# Patient Record
Sex: Female | Born: 2000 | Race: White | Hispanic: No | Marital: Single | Smoking: Current every day smoker
Health system: Southern US, Community
[De-identification: ages and names within clinical notes are randomized; demographics above are authoritative.]

---

## 2020-10-13 ENCOUNTER — Emergency Department (HOSPITAL_COMMUNITY)
Admission: EM | Admit: 2020-10-13 | Discharge: 2020-10-13 | Disposition: A | Discharge status: Home / Self Care | Payer: 59 | Attending: Emergency Medicine | Admitting: Emergency Medicine

## 2020-10-13 ENCOUNTER — Encounter (HOSPITAL_COMMUNITY): Payer: Self-pay | Admitting: Emergency Medicine

## 2020-10-13 ENCOUNTER — Emergency Department (HOSPITAL_COMMUNITY): Payer: 59

## 2020-10-13 DIAGNOSIS — F10129 Alcohol abuse with intoxication, unspecified: Secondary | ICD-10-CM | POA: Diagnosis not present

## 2020-10-13 DIAGNOSIS — R52 Pain, unspecified: Secondary | ICD-10-CM

## 2020-10-13 DIAGNOSIS — M7989 Other specified soft tissue disorders: Secondary | ICD-10-CM | POA: Insufficient documentation

## 2020-10-13 DIAGNOSIS — Y906 Blood alcohol level of 120-199 mg/100 ml: Secondary | ICD-10-CM | POA: Diagnosis not present

## 2020-10-13 DIAGNOSIS — S3991XA Unspecified injury of abdomen, initial encounter: Secondary | ICD-10-CM | POA: Diagnosis not present

## 2020-10-13 DIAGNOSIS — S060X0A Concussion without loss of consciousness, initial encounter: Secondary | ICD-10-CM | POA: Diagnosis not present

## 2020-10-13 DIAGNOSIS — S99921A Unspecified injury of right foot, initial encounter: Secondary | ICD-10-CM | POA: Diagnosis present

## 2020-10-13 DIAGNOSIS — S161XXA Strain of muscle, fascia and tendon at neck level, initial encounter: Secondary | ICD-10-CM

## 2020-10-13 DIAGNOSIS — S92114A Nondisplaced fracture of neck of right talus, initial encounter for closed fracture: Secondary | ICD-10-CM | POA: Insufficient documentation

## 2020-10-13 DIAGNOSIS — S298XXA Other specified injuries of thorax, initial encounter: Secondary | ICD-10-CM

## 2020-10-13 DIAGNOSIS — R000 Tachycardia, unspecified: Secondary | ICD-10-CM | POA: Insufficient documentation

## 2020-10-13 DIAGNOSIS — I959 Hypotension, unspecified: Secondary | ICD-10-CM | POA: Insufficient documentation

## 2020-10-13 DIAGNOSIS — S060X9A Concussion with loss of consciousness of unspecified duration, initial encounter: Secondary | ICD-10-CM

## 2020-10-13 DIAGNOSIS — Y92410 Unspecified street and highway as the place of occurrence of the external cause: Secondary | ICD-10-CM | POA: Diagnosis not present

## 2020-10-13 DIAGNOSIS — F10920 Alcohol use, unspecified with intoxication, uncomplicated: Secondary | ICD-10-CM

## 2020-10-13 DIAGNOSIS — S299XXA Unspecified injury of thorax, initial encounter: Secondary | ICD-10-CM | POA: Insufficient documentation

## 2020-10-13 DIAGNOSIS — Z20822 Contact with and (suspected) exposure to covid-19: Secondary | ICD-10-CM | POA: Diagnosis not present

## 2020-10-13 DIAGNOSIS — T1490XA Injury, unspecified, initial encounter: Secondary | ICD-10-CM

## 2020-10-13 LAB — COMPREHENSIVE METABOLIC PANEL
ALT: 31 U/L (ref 0–44)
AST: 52 U/L — ABNORMAL HIGH (ref 15–41)
Albumin: 4.3 g/dL (ref 3.5–5.0)
Alkaline Phosphatase: 34 U/L — ABNORMAL LOW (ref 38–126)
Anion gap: 14 (ref 5–15)
BUN: 8 mg/dL (ref 6–20)
CO2: 19 mmol/L — ABNORMAL LOW (ref 22–32)
Calcium: 9.2 mg/dL (ref 8.9–10.3)
Chloride: 110 mmol/L (ref 98–111)
Creatinine, Ser: 0.71 mg/dL (ref 0.44–1.00)
GFR, Estimated: >60 mL/min (ref >60)
Glucose, Bld: 99 mg/dL (ref 70–99)
Potassium: 3.7 mmol/L (ref 3.5–5.1)
Sodium: 143 mmol/L (ref 135–145)
Total Bilirubin: 0.8 mg/dL (ref 0.3–1.2)
Total Protein: 7.4 g/dL (ref 6.5–8.1)

## 2020-10-13 LAB — ETHANOL: Alcohol, Ethyl (B): 161 mg/dL — ABNORMAL HIGH (ref <10)

## 2020-10-13 LAB — URINALYSIS, ROUTINE W REFLEX MICROSCOPIC
Bacteria, UA: NONE SEEN
Bilirubin Urine: NEGATIVE
Glucose, UA: NEGATIVE mg/dL
Ketones, ur: 5 mg/dL — AB
Leukocytes,Ua: NEGATIVE
Nitrite: NEGATIVE
Protein, ur: NEGATIVE mg/dL
Specific Gravity, Urine: 1.034 — ABNORMAL HIGH (ref 1.005–1.030)
pH: 6 (ref 5.0–8.0)

## 2020-10-13 LAB — CBC
HCT: 45.1 % (ref 36.0–46.0)
Hemoglobin: 14.9 g/dL (ref 12.0–15.0)
MCH: 29.6 pg (ref 26.0–34.0)
MCHC: 33 g/dL (ref 30.0–36.0)
MCV: 89.7 fL (ref 80.0–100.0)
Platelets: 260 10*3/uL (ref 150–400)
RBC: 5.03 MIL/uL (ref 3.87–5.11)
RDW: 11.7 % (ref 11.5–15.5)
WBC: 19.3 10*3/uL — ABNORMAL HIGH (ref 4.0–10.5)
nRBC: 0 % (ref 0.0–0.2)

## 2020-10-13 LAB — I-STAT CHEM 8, ED
BUN: 7 mg/dL (ref 6–20)
Calcium, Ion: 1.11 mmol/L — ABNORMAL LOW (ref 1.15–1.40)
Chloride: 112 mmol/L — ABNORMAL HIGH (ref 98–111)
Creatinine, Ser: 0.7 mg/dL (ref 0.44–1.00)
Glucose, Bld: 106 mg/dL — ABNORMAL HIGH (ref 70–99)
HCT: 45 % (ref 36.0–46.0)
Hemoglobin: 15.3 g/dL — ABNORMAL HIGH (ref 12.0–15.0)
Potassium: 3.5 mmol/L (ref 3.5–5.1)
Sodium: 145 mmol/L (ref 135–145)
TCO2: 18 mmol/L — ABNORMAL LOW (ref 22–32)

## 2020-10-13 LAB — RAPID URINE DRUG SCREEN, HOSP PERFORMED
Amphetamines: NOT DETECTED
Barbiturates: NOT DETECTED
Benzodiazepines: NOT DETECTED
Cocaine: NOT DETECTED
Opiates: NOT DETECTED
Tetrahydrocannabinol: NOT DETECTED

## 2020-10-13 LAB — I-STAT BETA HCG BLOOD, ED (MC, WL, AP ONLY): I-stat hCG, quantitative: <5 m[IU]/mL (ref <5)

## 2020-10-13 LAB — SAMPLE TO BLOOD BANK

## 2020-10-13 LAB — RESP PANEL BY RT-PCR (FLU A&B, COVID) ARPGX2
Influenza A by PCR: NEGATIVE
Influenza B by PCR: NEGATIVE
SARS Coronavirus 2 by RT PCR: NEGATIVE

## 2020-10-13 LAB — PROTIME-INR
INR: 1 (ref 0.8–1.2)
Prothrombin Time: 12.8 seconds (ref 11.4–15.2)

## 2020-10-13 LAB — LACTIC ACID, PLASMA: Lactic Acid, Venous: 5.1 mmol/L (ref 0.5–1.9)

## 2020-10-13 MED ORDER — IOHEXOL 300 MG/ML  SOLN
100.0000 mL | Freq: Once | INTRAMUSCULAR | Status: AC | PRN
  Administered 2020-10-13: 100 mL via INTRAVENOUS

## 2020-10-13 MED ORDER — LACTATED RINGERS IV BOLUS
1000.0000 mL | Freq: Once | INTRAVENOUS | Status: AC
  Administered 2020-10-13: 1000 mL via INTRAVENOUS

## 2020-10-13 MED ORDER — LACTATED RINGERS IV BOLUS
2000.0000 mL | Freq: Once | INTRAVENOUS | Status: AC
  Administered 2020-10-13: 2000 mL via INTRAVENOUS

## 2020-10-13 MED ORDER — HYDROCODONE-ACETAMINOPHEN 5-325 MG PO TABS: 1.0000 | ORAL_TABLET | Freq: Four times a day (QID) | ORAL | 0 refills | Status: AC | PRN

## 2020-10-13 NOTE — ED Notes (Signed)
Called ortho, said that they will come down shortly.
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
## 2020-10-13 NOTE — ED Notes (Signed)
Mother at bedside.

## 2020-10-13 NOTE — ED Notes (Signed)
Pt's family step father Gabriel Rung and Mother Laurina Bustle updated regarding pt's arrival to ED per pt request. Informed visitation policy. Pt was able to talk to both.

## 2020-10-13 NOTE — ED Notes (Signed)
Pt transport to CT by this RN °

## 2020-10-13 NOTE — ED Notes (Signed)
Date and time results received: 10/13/20 0556  Test: lactic acid Critical Value: 5.1  Name of Provider Notified: Dr, Bebe Shaggy  Orders Received? Or Actions Taken?: n/a
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
## 2020-10-13 NOTE — Progress Notes (Signed)
Orthopedic Tech Progress Note Patient Details:  Stephanie Weber 09/02/2000 [id number]   Ortho Devices Type of Ortho Device: CAM walker Ortho Device/Splint Location: RLE Ortho Device/Splint Interventions: Ordered, Application, Adjustment   Post Interventions Patient Tolerated: Well Instructions Provided: Care of device, Adjustment of device, Poper ambulation with device  Stephanie Weber Carmine Savoy 10/13/2020, 11:57 AM

## 2020-10-13 NOTE — ED Notes (Signed)
Talking to mother on phone 
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
## 2020-10-13 NOTE — ED Notes (Signed)
[phone] Mom Vernona Rieger would like an update.

## 2020-10-13 NOTE — ED Notes (Signed)
EDP made aware of pt becoming to answer. No new orders
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
## 2020-10-13 NOTE — Discharge Instructions (Addendum)
Return for any problem.  ?
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
